# Patient Record
Sex: Male | Born: 1995
Health system: Southern US, Community
[De-identification: ages and names within clinical notes are randomized; demographics above are authoritative.]

---
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
## 2016-01-21 ENCOUNTER — Encounter (HOSPITAL_COMMUNITY): Payer: Self-pay | Admitting: *Deleted

## 2016-01-21 ENCOUNTER — Emergency Department (HOSPITAL_COMMUNITY)
Admission: EM | Admit: 2016-01-21 | Discharge: 2016-01-21 | Disposition: A | Discharge status: Home / Self Care | Payer: 59 | Attending: Emergency Medicine | Admitting: Emergency Medicine

## 2016-01-21 ENCOUNTER — Emergency Department (HOSPITAL_COMMUNITY): Payer: 59

## 2016-01-21 DIAGNOSIS — S3992XA Unspecified injury of lower back, initial encounter: Secondary | ICD-10-CM | POA: Insufficient documentation

## 2016-01-21 DIAGNOSIS — Z792 Long term (current) use of antibiotics: Secondary | ICD-10-CM | POA: Insufficient documentation

## 2016-01-21 DIAGNOSIS — Z79899 Other long term (current) drug therapy: Secondary | ICD-10-CM | POA: Diagnosis not present

## 2016-01-21 DIAGNOSIS — Y92410 Unspecified street and highway as the place of occurrence of the external cause: Secondary | ICD-10-CM | POA: Diagnosis not present

## 2016-01-21 DIAGNOSIS — Y999 Unspecified external cause status: Secondary | ICD-10-CM | POA: Insufficient documentation

## 2016-01-21 DIAGNOSIS — Y9389 Activity, other specified: Secondary | ICD-10-CM | POA: Insufficient documentation

## 2016-01-21 MED ORDER — IBUPROFEN 800 MG PO TABS: 800.0000 mg | ORAL_TABLET | Freq: Three times a day (TID) | ORAL | Status: AC

## 2016-01-21 MED ORDER — CYCLOBENZAPRINE HCL 5 MG PO TABS: 5.0000 mg | ORAL_TABLET | Freq: Once | ORAL | Status: AC

## 2016-01-21 MED ORDER — KETOROLAC TROMETHAMINE 60 MG/2ML IM SOLN
60.0000 mg | Freq: Once | INTRAMUSCULAR | Status: AC
  Administered 2016-01-21: 60 mg via INTRAMUSCULAR
  Filled 2016-01-21: qty 2

## 2016-01-21 MED ORDER — CYCLOBENZAPRINE HCL 10 MG PO TABS
5.0000 mg | ORAL_TABLET | Freq: Once | ORAL | Status: AC
  Administered 2016-01-21: 5 mg via ORAL
  Filled 2016-01-21: qty 1
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
